# Patient Record
Sex: Male | Born: 2005 | Race: Black or African American | Hispanic: No | Marital: Single | Smoking: Never smoker
Health system: Southern US, Community
[De-identification: ages and names within clinical notes are randomized; demographics above are authoritative.]

## PROBLEM LIST (undated history)

## (undated) DIAGNOSIS — T7840XA Allergy, unspecified, initial encounter: Secondary | ICD-10-CM

## (undated) DIAGNOSIS — J45909 Unspecified asthma, uncomplicated: Secondary | ICD-10-CM

## (undated) DIAGNOSIS — F909 Attention-deficit hyperactivity disorder, unspecified type: Secondary | ICD-10-CM

## (undated) HISTORY — DX: Unspecified asthma, uncomplicated: J45.909

## (undated) HISTORY — PX: CIRCUMCISION: SUR203

## (undated) HISTORY — DX: Allergy, unspecified, initial encounter: T78.40XA

## (undated) HISTORY — DX: Attention-deficit hyperactivity disorder, unspecified type: F90.9

---
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
## 2023-04-12 ENCOUNTER — Encounter (INDEPENDENT_AMBULATORY_CARE_PROVIDER_SITE_OTHER): Payer: Self-pay | Admitting: Family

## 2023-04-12 ENCOUNTER — Ambulatory Visit (INDEPENDENT_AMBULATORY_CARE_PROVIDER_SITE_OTHER): Payer: MEDICAID | Admitting: Family

## 2023-04-12 VITALS — BP 102/62 | HR 76 | Wt 190.2 lb

## 2023-04-12 DIAGNOSIS — E88819 Insulin resistance, unspecified: Secondary | ICD-10-CM

## 2023-04-12 DIAGNOSIS — N62 Hypertrophy of breast: Secondary | ICD-10-CM

## 2023-04-12 DIAGNOSIS — E6609 Other obesity due to excess calories: Secondary | ICD-10-CM

## 2023-04-12 DIAGNOSIS — Z68.41 Body mass index (BMI) pediatric, greater than or equal to 95th percentile for age: Secondary | ICD-10-CM

## 2023-04-12 DIAGNOSIS — L83 Acanthosis nigricans: Secondary | ICD-10-CM

## 2023-04-12 DIAGNOSIS — E8881 Metabolic syndrome: Secondary | ICD-10-CM

## 2023-04-12 DIAGNOSIS — E669 Obesity, unspecified: Secondary | ICD-10-CM

## 2023-04-12 LAB — POCT GLYCOSYLATED HEMOGLOBIN (HGB A1C): Hemoglobin A1C: 5.3 % (ref 4.0–5.6)

## 2023-04-12 LAB — POCT GLUCOSE (DEVICE FOR HOME USE): Glucose Fasting, POC: 95 mg/dL (ref 70–99)

## 2023-04-12 NOTE — Progress Notes (Signed)
Pediatric Endocrinology Consultation Initial Visit  Jared, Mills [birth year]  Georgiann Hahn, MD  Chief Complaint: Obesity, Gynecomastia   History obtained from: patient, parent, and review of records from PCP  HPI: Jared Mills  is a 17 y.o. male being seen in consultation at the request of  Georgiann Hahn, MD for evaluation of the above concerns.  [name]   1.  Jared Mills [facility] and has been followed by Dr. Fransico Michael. He as initially seen for concern for gynecomastia that Dr. Fransico Michael found were likely due to a combination of puberty and obesity. At his last visit with Dr. Fransico Michael on 01/2022, it was noted that gyencomastia was improving and no breast buds were present. He also check thyroid antibodies (negative) and had normal TFT's.    2. Jared Mills was last seen in clinic on 11/2022, since that time he has been well.   He has been busy working about 15 hours per week at [employer]. He will be starting his senior year this fall. Denies changes in breast tissue since last visit.   Diet:  - diet has been "not to well".  - He has cut back on sugar drinks, has about 4-5 sugar drinks per week.  - Gets fast food or goes out to eat 1-2 x per week.  - When he cooks meals at home, he gets one plate per meal. He reports balanced meals.  - Snacks: chips. Has one snack per day or less.   Activity:  - Rides scooter or walks daily  - Estimates time is 30-45 minutes total.    ROS: All systems reviewed with pertinent positives listed below; otherwise negative. Constitutional: + 9 lbs weight gain.  Sleeping well HEENT: No difficulty swallowing. No vision changes.  Respiratory: No increased work of breathing currently GI: No constipation or diarrhea GU: No polyuria or nocturia. He has completed puberty  Musculoskeletal: No joint deformity Neuro: Normal affect. No tremors. No headache.  Endocrine: As above   Past Medical  History:  Past Medical History:  Diagnosis Date   ADHD (attention deficit hyperactivity disorder)    Allergy    Asthma     Birth History: Delivered at 34 weeks  Birth weight 5lb 6oz Discharged home with mom  Meds: Outpatient Encounter Medications as of 04/12/2023  Medication Sig   ADDERALL XR 20 MG 24 hr capsule Take 1 capsule (20 mg total) by mouth daily.   ADDERALL XR 20 MG 24 hr capsule Take 1 capsule (20 mg total) by mouth daily. (Patient not taking: Reported on 12/10/2022)   ADDERALL XR 20 MG 24 hr capsule Take 1 capsule (20 mg total) by mouth daily. (Patient not taking: Reported on 12/10/2022)   omeprazole (PRILOSEC) 20 MG capsule Take one capsule twice daily. (Patient not taking: Reported on 12/10/2022)   No facility-administered encounter medications on file as of 04/12/2023.    Allergies: No Known Allergies  Surgical History: Past Surgical History:  Procedure Laterality Date   CIRCUMCISION      Family History:  Family History  Problem Relation Age of Onset   Asthma Mother    Sickle cell trait Mother    Asthma Sister    Asthma Brother    Cancer Maternal Grandmother        Lung   Asthma Sister    Alcohol abuse Neg Hx    Arthritis Neg Hx    Birth defects Neg Hx    COPD  Neg Hx    Depression Neg Hx    Diabetes Neg Hx    Drug abuse Neg Hx    Early death Neg Hx    Hearing loss Neg Hx    Heart disease Neg Hx    Hyperlipidemia Neg Hx    Kidney disease Neg Hx    Hypertension Neg Hx    Learning disabilities Neg Hx    Mental illness Neg Hx    Mental retardation Neg Hx    Stroke Neg Hx    Miscarriages / Stillbirths Neg Hx    Vision loss Neg Hx    Varicose Veins Neg Hx    Intellectual disability Neg Hx    ADD / ADHD Neg Hx    Anxiety disorder Neg Hx    Obesity Neg Hx    Thyroid disease Neg Hx     Social History: Lives with: Mom and siblings  Currently in 12th grade Social History   Social History Narrative   12th grade at [employer]..     Lives with mom and 1 older sister, 1 younger sister and 1 younger brother.      Physical Exam:  Vitals:   04/12/23 1457  BP: 102/62  Pulse: 76  Weight: 190 lb 3.2 oz (86.3 kg)      Body mass index: body mass index is 31.23 kg/m. Blood pressure %iles are not available for patients who are 18 years or older.  Wt Readings from Last 3 Encounters:  04/12/23 190 lb 3.2 oz (86.3 kg) (89%, Z= 1.25)*  12/10/22 181 lb 3.2 oz (82.2 kg) (85%, Z= 1.05)*  11/16/22 177 lb 4.8 oz (80.4 kg) (83%, Z= 0.94)*   * Growth percentiles are based on CDC (Boys, 2-20 Years) data.   Ht Readings from Last 3 Encounters:  12/10/22 5' 5.43" (1.662 m) (8%, Z= -1.42)*  11/16/22 5' 5.2" (1.656 m) (7%, Z= -1.50)*  08/10/22 5' 5.6" (1.666 m) (9%, Z= -1.34)*   * Growth percentiles are based on CDC (Boys, 2-20 Years) data.     89 %ile (Z= 1.25) based on CDC (Boys, 2-20 Years) weight-for-age data using data from 04/12/2023. No height on file for this encounter. 96 %ile (Z= 1.74) based on CDC (Boys, 2-20 Years) BMI-for-age data using weight from 04/12/2023 and height from 12/10/2022.  General: Well developed, well nourished male in no acute distress.  Head: Normocephalic, atraumatic.   Eyes:  Pupils equal and round. EOMI.  Sclera white.  No eye drainage.   Ears/Nose/Mouth/Throat: Nares patent, no nasal drainage.  Normal dentition, mucous membranes moist.  Neck: supple, no cervical lymphadenopathy, no thyromegaly Cardiovascular: regular rate, normal S1/S2, no murmurs Respiratory: No increased work of breathing.  Lungs clear to auscultation bilaterally.  No wheezes. Abdomen: soft, nontender, nondistended. Normal bowel sounds.  No appreciable masses  Extremities: warm, well perfused, cap refill < 2 sec.   Musculoskeletal: Normal muscle mass.  Normal strength Skin: warm, dry.  No rash or lesions. Neurologic: alert and oriented, normal speech, no tremor Chest: + bilateral gynecomastia, primarily fatty tissue. No  palpable breast buds.    Laboratory Evaluation: Results for orders placed or performed in visit on 04/12/23  POCT glycosylated hemoglobin (Hb A1C)  Result Value Ref Range   Hemoglobin A1C 5.3 4.0 - 5.6 %   HbA1c POC (<> result, manual entry)     HbA1c, POC (prediabetic range)     HbA1c, POC (controlled diabetic range)    POCT Glucose (Device for Home Use)  Result Value Ref Range   Glucose Fasting, POC 95 70 - 99 mg/dL   POC Glucose       Assessment/Plan: Jared Mills is a 17 y.o. 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 He has gained 9 lbs since last visit, BMI is >95th%ile. Hemoglobin A1c normal at 5.3%. Gynecomastia is stable.   1. Insulin resistance 2. Acanthosis nigricans  3. Obesity  - Reviewed growth chart with family  - Encouraged at least 30 minutes of activity daily  - Eliminate sugar drinks.  - Reduce intake of junk food and fast food/going out to eat  - Discussed importance of healthy diet and daily activity to reduce insulin resistance.   3. Gynecomastia - Discussed gynecomastia with family. Will continue to monitor for changes.  - He does not want referral to Plastic surgery for evaluation at this time.      Follow-up:   Return in about 4 months (around 08/12/2023).   Medical decision-making:  LOS: >40  spent today reviewing the medical chart, counseling the patient/family, and documenting today's visit.     Gretchen Short,  FNP-C  Pediatric Specialist  [address]  [place], [address]  Tele: [phone]

## 2023-04-12 NOTE — Patient Instructions (Signed)
It was a pleasure seeing you in clinic today. Please do not hesitate to contact me if you have questions or concerns.   Please sign up for MyChart. This is a communication tool that allows you to send an email directly to me. This can be used for questions, prescriptions and blood sugar reports. We will also release labs to you with instructions on MyChart. Please do not use MyChart if you need immediate or emergency assistance. Ask our wonderful front office staff if you need assistance.   -Eliminate sugary drinks (regular soda, juice, sweet tea, regular gatorade) from your diet -Drink water or milk (preferably 1% or skim) -Avoid fried foods and junk food (chips, cookies, candy) -Watch portion sizes -Pack your lunch for school -Try to get 30 minutes of activity daily  A1c 5.3 %

## 2023-05-11 ENCOUNTER — Encounter: Payer: Self-pay | Admitting: Pediatrics

## 2023-05-18 ENCOUNTER — Other Ambulatory Visit: Payer: Self-pay | Admitting: Pediatrics

## 2023-05-19 MED ORDER — ADDERALL XR 20 MG PO CP24: 20.0000 mg | ORAL_CAPSULE | Freq: Every day | ORAL | 0 refills | Status: DC

## 2023-06-11 IMAGING — CR DG BONE AGE
1 series · 1 of 1 positions shown · non-contrast
Comparison: None.

CLINICAL DATA: Linear growth delay.  Gynecomastia.

EXAM:
BONE AGE DETERMINATION
TECHNIQUE: AP radiographs of the hand and wrist are correlated with the
developmental standards of Greulich and Pyle.

[x hand pa left]
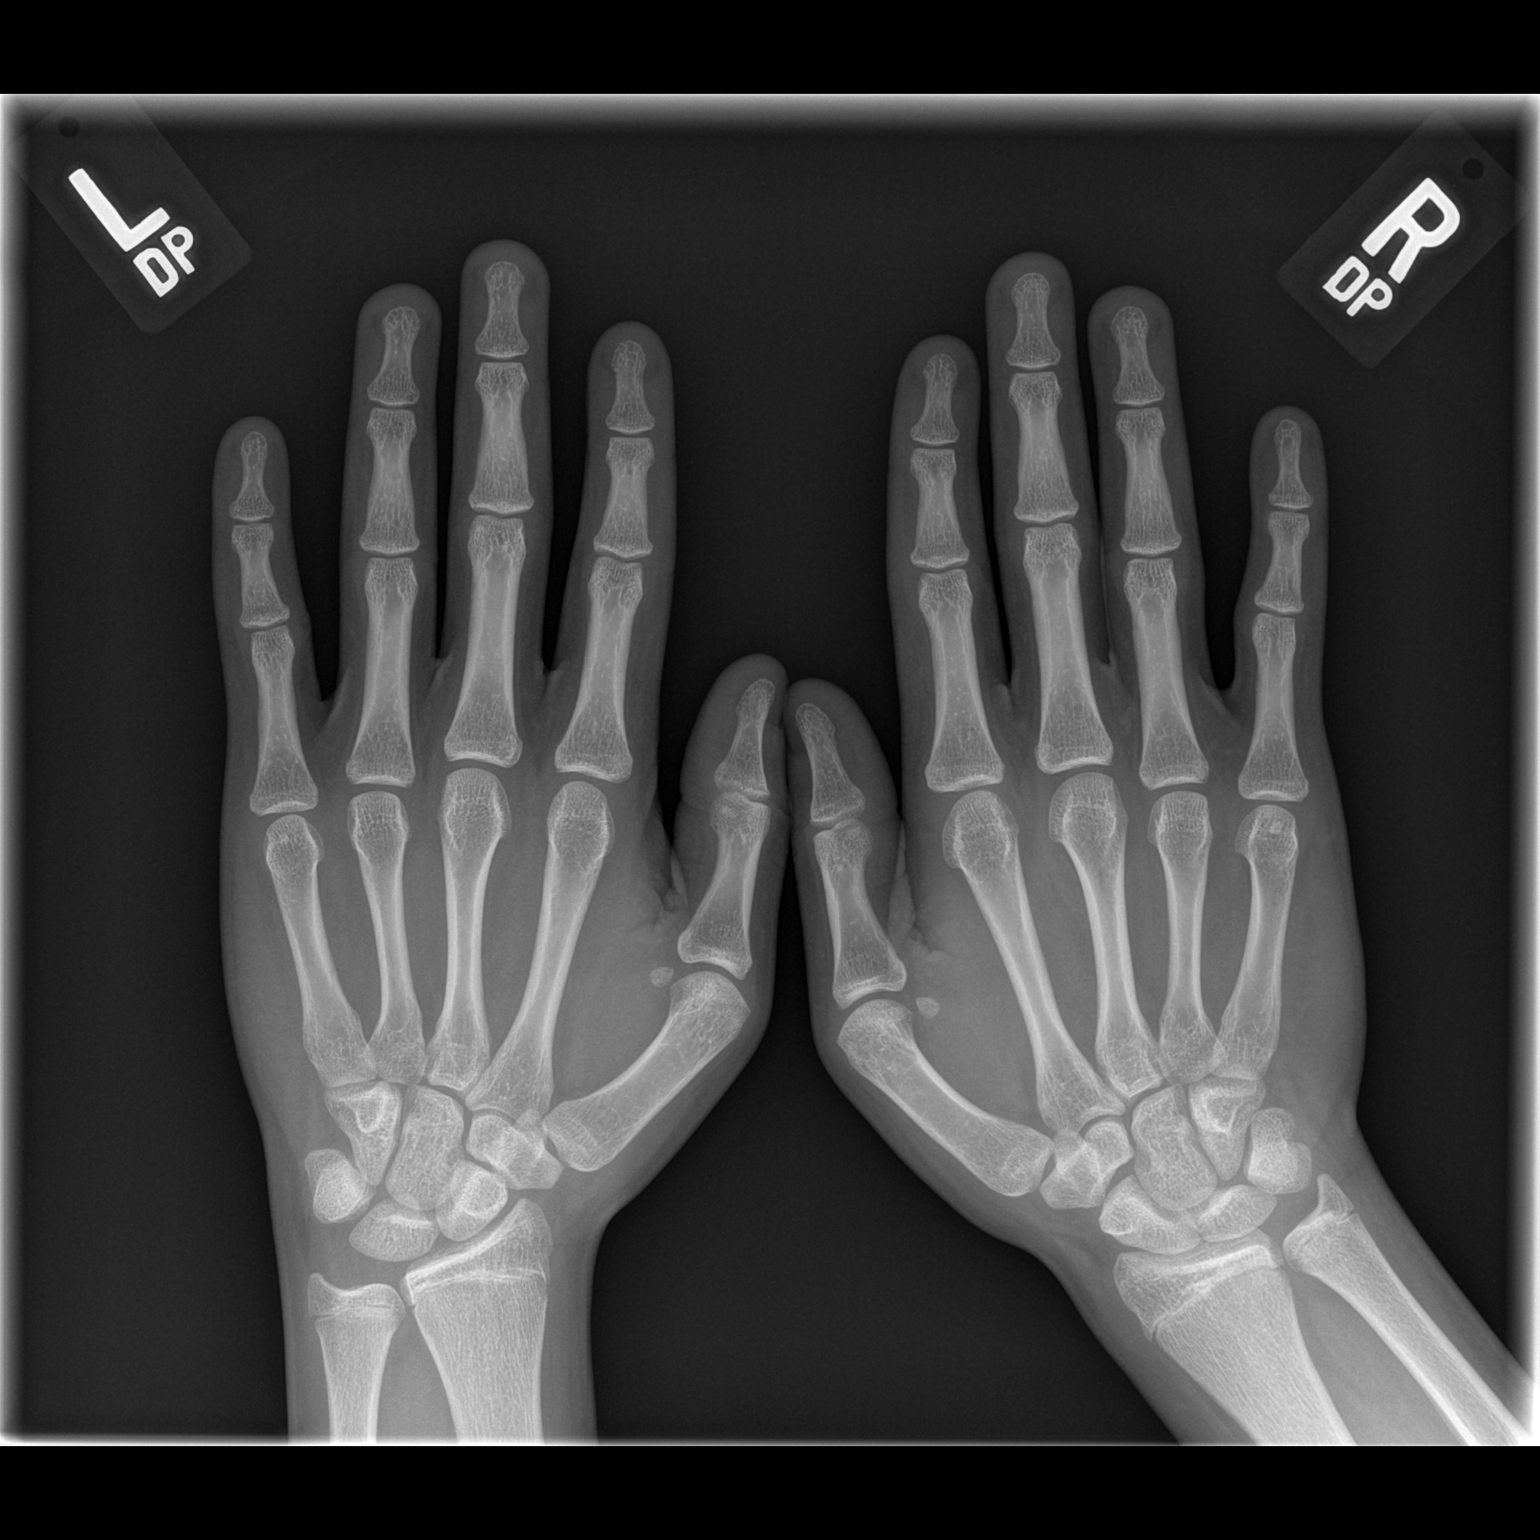

[1 of 1 positions shown; findings below may reference images not displayed]

FINDINGS: At the chronological age of 15 years, 4 months, using the [HOSPITAL] data, the mean bone age for calculation is 15 years, 0
months. Two standard deviations at this age is 26.1 months, giving a
normal range of 15 years, 2 months to 19 years, 6 months (+/- 2
standard deviations).

By the method of Greulich and Pyle, the bone age is estimated to be
15 years, 0 months.
IMPRESSION: Concordant bone age.

## 2023-07-12 ENCOUNTER — Other Ambulatory Visit: Payer: Self-pay | Admitting: Pediatrics

## 2023-07-13 MED ORDER — ADDERALL XR 20 MG PO CP24: 20.0000 mg | ORAL_CAPSULE | Freq: Every day | ORAL | 0 refills | Status: AC

## 2023-08-11 ENCOUNTER — Other Ambulatory Visit (INDEPENDENT_AMBULATORY_CARE_PROVIDER_SITE_OTHER): Payer: Self-pay | Admitting: Family

## 2023-08-11 ENCOUNTER — Telehealth (INDEPENDENT_AMBULATORY_CARE_PROVIDER_SITE_OTHER): Payer: Self-pay | Admitting: Family

## 2023-08-11 DIAGNOSIS — E8881 Metabolic syndrome: Secondary | ICD-10-CM

## 2023-08-11 DIAGNOSIS — E6609 Other obesity due to excess calories: Secondary | ICD-10-CM

## 2023-08-11 NOTE — Telephone Encounter (Signed)
Spoke with mom she would rather go to [facility].

## 2023-08-11 NOTE — Telephone Encounter (Signed)
Mom called in to cancel Jared Mills's appointment that was scheduled for tomorrow 08/12/23 with Gretchen Short. She states they have moved and would like a referral sent to a Endo provider in [place]. A good callback for mom would be [phone].

## 2023-08-12 ENCOUNTER — Ambulatory Visit (INDEPENDENT_AMBULATORY_CARE_PROVIDER_SITE_OTHER): Payer: Self-pay | Admitting: Family

## 2023-08-12 NOTE — Telephone Encounter (Signed)
Spoke with mom, relayed Jared Mills's message. Mom verbalized understanding. She did want to know if he had to be seen in general, and I did let her know that some of the adult endocrinologist won't see a pt unless they were diagnosed and of that happens then have him follow up with his pcp to continue to get his A1C checked.

## 2023-08-23 ENCOUNTER — Encounter (INDEPENDENT_AMBULATORY_CARE_PROVIDER_SITE_OTHER): Payer: Self-pay

## 2023-12-07 ENCOUNTER — Encounter (INDEPENDENT_AMBULATORY_CARE_PROVIDER_SITE_OTHER): Payer: Self-pay

## 2023-12-20 ENCOUNTER — Encounter (INDEPENDENT_AMBULATORY_CARE_PROVIDER_SITE_OTHER): Payer: Self-pay
